# Patient Record
Sex: Male | Born: 1945 | Race: Black or African American | Hispanic: No | Smoking: Never smoker
Health system: Southern US, Community
[De-identification: ages and names within clinical notes are randomized; demographics above are authoritative.]

---

## 2003-05-15 ENCOUNTER — Emergency Department (HOSPITAL_COMMUNITY): Admission: EM | Admit: 2003-05-15 | Discharge: 2003-05-15 | Payer: Self-pay | Admitting: Emergency Medicine

## 2003-05-15 ENCOUNTER — Encounter: Payer: Self-pay | Admitting: Emergency Medicine

## 2012-07-18 ENCOUNTER — Encounter (HOSPITAL_COMMUNITY): Payer: Self-pay | Admitting: *Deleted

## 2012-07-18 ENCOUNTER — Emergency Department (HOSPITAL_COMMUNITY): Payer: No Typology Code available for payment source

## 2012-07-18 ENCOUNTER — Emergency Department (HOSPITAL_COMMUNITY)
Admission: EM | Admit: 2012-07-18 | Discharge: 2012-07-18 | Disposition: A | Discharge status: Home / Self Care | Payer: No Typology Code available for payment source | Attending: Emergency Medicine | Admitting: Emergency Medicine

## 2012-07-18 DIAGNOSIS — R079 Chest pain, unspecified: Secondary | ICD-10-CM | POA: Insufficient documentation

## 2012-07-18 DIAGNOSIS — M549 Dorsalgia, unspecified: Secondary | ICD-10-CM | POA: Insufficient documentation

## 2012-07-18 DIAGNOSIS — Y92410 Unspecified street and highway as the place of occurrence of the external cause: Secondary | ICD-10-CM | POA: Insufficient documentation

## 2012-07-18 DIAGNOSIS — M542 Cervicalgia: Secondary | ICD-10-CM | POA: Insufficient documentation

## 2012-07-18 DIAGNOSIS — Y9389 Activity, other specified: Secondary | ICD-10-CM | POA: Insufficient documentation

## 2012-07-18 MED ORDER — DIAZEPAM 5 MG PO TABS: 5.0000 mg | ORAL_TABLET | Freq: Two times a day (BID) | ORAL | Status: AC

## 2012-07-18 MED ORDER — HYDROCODONE-ACETAMINOPHEN 5-325 MG PO TABS: 2.0000 | ORAL_TABLET | Freq: Four times a day (QID) | ORAL | Status: DC | PRN
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
## 2012-07-19 NOTE — ED Provider Notes (Signed)
Medical screening examination/treatment/procedure(s) were performed by non-physician practitioner and as supervising physician I was immediately available for consultation/collaboration.  Flint Melter, MD 07/19/12 [phone]

## 2013-06-04 ENCOUNTER — Ambulatory Visit: Payer: Self-pay

## 2013-06-09 ENCOUNTER — Ambulatory Visit: Payer: Self-pay

## 2021-07-21 ENCOUNTER — Emergency Department (HOSPITAL_COMMUNITY)
Admission: EM | Admit: 2021-07-21 | Discharge: 2021-07-22 | Disposition: A | Discharge status: Home / Self Care | Payer: Medicare PPO | Attending: Emergency Medicine | Admitting: Emergency Medicine

## 2021-07-21 ENCOUNTER — Other Ambulatory Visit: Payer: Self-pay

## 2021-07-21 ENCOUNTER — Emergency Department (HOSPITAL_COMMUNITY): Payer: Medicare PPO

## 2021-07-21 DIAGNOSIS — S81811A Laceration without foreign body, right lower leg, initial encounter: Secondary | ICD-10-CM | POA: Insufficient documentation

## 2021-07-21 DIAGNOSIS — Z23 Encounter for immunization: Secondary | ICD-10-CM | POA: Insufficient documentation

## 2021-07-21 DIAGNOSIS — S61412A Laceration without foreign body of left hand, initial encounter: Secondary | ICD-10-CM | POA: Diagnosis not present

## 2021-07-21 DIAGNOSIS — S81832A Puncture wound without foreign body, left lower leg, initial encounter: Secondary | ICD-10-CM | POA: Insufficient documentation

## 2021-07-21 DIAGNOSIS — W540XXA Bitten by dog, initial encounter: Secondary | ICD-10-CM | POA: Diagnosis not present

## 2021-07-21 DIAGNOSIS — W1839XA Other fall on same level, initial encounter: Secondary | ICD-10-CM | POA: Diagnosis not present

## 2021-07-21 DIAGNOSIS — S51832A Puncture wound without foreign body of left forearm, initial encounter: Secondary | ICD-10-CM | POA: Insufficient documentation

## 2021-07-21 DIAGNOSIS — S59912A Unspecified injury of left forearm, initial encounter: Secondary | ICD-10-CM | POA: Diagnosis present

## 2021-07-21 MED ORDER — AMOXICILLIN-POT CLAVULANATE 875-125 MG PO TABS
1.0000 | ORAL_TABLET | Freq: Once | ORAL | Status: AC
  Administered 2021-07-21: 1 via ORAL
  Filled 2021-07-21: qty 1

## 2021-07-21 MED ORDER — BACITRACIN ZINC 500 UNIT/GM EX OINT
TOPICAL_OINTMENT | CUTANEOUS | Status: AC
  Filled 2021-07-21: qty 3.6

## 2021-07-21 MED ORDER — AMOXICILLIN-POT CLAVULANATE 875-125 MG PO TABS: 1.0000 | ORAL_TABLET | Freq: Two times a day (BID) | ORAL | 0 refills | Status: AC

## 2021-07-21 MED ORDER — HYDROCODONE-ACETAMINOPHEN 5-325 MG PO TABS: 0.5000 | ORAL_TABLET | Freq: Four times a day (QID) | ORAL | 0 refills | Status: AC | PRN

## 2021-07-21 MED ORDER — METHOCARBAMOL 500 MG PO TABS: 500.0000 mg | ORAL_TABLET | Freq: Two times a day (BID) | ORAL | 0 refills | Status: AC | PRN

## 2021-07-21 MED ORDER — LIDOCAINE-EPINEPHRINE (PF) 2 %-1:200000 IJ SOLN
20.0000 mL | Freq: Once | INTRAMUSCULAR | Status: AC
  Administered 2021-07-21: 20 mL
  Filled 2021-07-21: qty 20

## 2021-07-21 MED ORDER — TETANUS-DIPHTH-ACELL PERTUSSIS 5-2.5-18.5 LF-MCG/0.5 IM SUSY
0.5000 mL | PREFILLED_SYRINGE | Freq: Once | INTRAMUSCULAR | Status: AC
  Administered 2021-07-21: 0.5 mL via INTRAMUSCULAR
  Filled 2021-07-21: qty 0.5

## 2021-07-21 MED ORDER — METHOCARBAMOL 500 MG PO TABS
500.0000 mg | ORAL_TABLET | Freq: Once | ORAL | Status: AC
  Administered 2021-07-22: 500 mg via ORAL
  Filled 2021-07-21: qty 1

## 2021-07-21 NOTE — Discharge Instructions (Addendum)
1. Medications: Tylenol or ibuprofen for pain, Augmentin for infection. Take norco as needed for severe or breakthrough pain. Have caution, this may make you tired or groggy. Use robaxin as needed for muscle pain or soreness. 2. Treatment: ice for swelling, keep wound clean with warm soap and water and keep bandage dry 3. Follow Up: Please return in 10-14 days to have your stitches removed or sooner if you have concerns. Return to the emergency department for increased redness, drainage of pus from the wound   WOUND CARE  Remove bandage and wash wound gently with mild soap and warm water. Reapply a new bandage after cleaning wound, if directed.   Continue daily cleansing with soap and water until stitches are removed.  Do not apply any ointments or creams to the wound while stitches are in place, as this may cause delayed healing. Return if you experience any of the following signs of infection: Swelling, redness, pus drainage, streaking, fever >101.0 F  Return if you experience excessive bleeding that does not stop after 15-20 minutes of constant, firm pressure.

## 2021-07-21 NOTE — ED Triage Notes (Addendum)
Pt [facility]. Pt bit by sons dog. Pt has puncture wounds to both legs. Pt has puncture wounds to both wrists, and right hand. Pt fell after dog bites. Pt states dog has had its shots.

## 2021-07-21 NOTE — ED Provider Notes (Signed)
[facility]-EMERGENCY DEPT Provider Note   CSN: [id number] Arrival date & time: 07/21/21  1912     History Chief Complaint  Patient presents with   Animal Bite    Mark Gross is a 75 y.o. male presenting for evaluation after a Gross bite.   Patient states just prior to arrival he was bitten Mark Gross.  When he was bitten, he fell back, but did not hit his head.  No injury from the fall.  He reports pain and injury mostly of the right leg, but also the left leg and bilateral hands.  He is not on blood thinners.  He has a h/o asthma for which he takes albuterol, no other medical problems or medicine.  Reports pain at the site of the bites and surrounding areas.  He has been able to ambulate.  No numbness or tingling.  His tetanus is not up-to-date.  He denies injury on his torso   HPI     No past medical history on file.  There are no problems to display for this patient.   No past surgical history on file.     No family history on file.  Social History   Tobacco Use   Smoking status: Never  Substance Use Topics   Alcohol use: No   Drug use: No    Home Medications Prior to Admission medications   Medication Sig Start Date End Date Taking? Authorizing Provider  amoxicillin-clavulanate (AUGMENTIN) 875-125 MG tablet Take 1 tablet Mark mouth every 12 (twelve) hours. 07/21/21  Yes Cleston Lautner, PA-C  HYDROcodone-acetaminophen (NORCO/VICODIN) 5-325 MG tablet Take 0.5-1 tablets Mark mouth every 6 (six) hours as needed. 07/21/21  Yes Chantia Amalfitano, PA-C  methocarbamol (ROBAXIN) 500 MG tablet Take 1 tablet (500 mg total) Mark mouth 2 (two) times daily as needed for muscle spasms. 07/21/21  Yes Romanita Fager, PA-C  diazepam (VALIUM) 5 MG tablet Take 1 tablet (5 mg total) Mark mouth 2 (two) times daily. 07/18/12   Santiago Glad, PA-C  ibuprofen (ADVIL,MOTRIN) 200 MG tablet Take 200 mg Mark mouth every 6 (six) hours as needed.    [provider]  Pseudoeph-Doxylamine-DM-APAP (NYQUIL PO) Take 1 capsule Mark mouth at bedtime as needed. sleep    [provider]    Allergies    Patient has no known allergies.  Review of Systems   Review of Systems  Skin:  Positive for wound.  Hematological:  Does not bruise/bleed easily.  All other systems reviewed and are negative.  Physical Exam Updated Vital Signs BP 132/77   Pulse 87   Temp 97.9 F (36.6 C) (Oral)   Resp 18   SpO2 99%   Physical Exam Vitals and nursing note reviewed.  Constitutional:      General: He is not in acute distress.    Appearance: Normal appearance.  HENT:     Head: Normocephalic and atraumatic.  Eyes:     Conjunctiva/sclera: Conjunctivae normal.     Pupils: Pupils are equal, round, and reactive to light.  Cardiovascular:     Rate and Rhythm: Normal rate and regular rhythm.     Pulses: Normal pulses.  Pulmonary:     Effort: Pulmonary effort is normal. No respiratory distress.     Breath sounds: Normal breath sounds. No wheezing.     Comments: Speaking in full sentences.  Clear lung sounds in all fields. Abdominal:     General: There is no distension.     Palpations:  Abdomen is soft. There is no mass.     Tenderness: There is no abdominal tenderness. There is no guarding or rebound.  Musculoskeletal:        General: Normal range of motion.       Arms:     Cervical back: Normal range of motion and neck supple.       Legs:     Comments: Many puncture wounds and lacerations of the upper and lower extremities. On the upper extremities, laceration on the palm of the left hand and over the distal ulna.  Puncture wounds of the volar wrist/distal forearm of the right upper extremity. Large gaping lacerations on the medial right lower leg.  Multiple puncture wounds and skin abrasions of the lateral leg. V-shaped gaping wound over the posterior left calf.  Gaping wound of the L anterior shin.  Multiple puncture wounds and abrasions of the  anterior left shin   Skin:    General: Skin is warm and dry.     Capillary Refill: Capillary refill takes less than 2 seconds.  Neurological:     Mental Status: He is alert and oriented to person, place, and time.  Psychiatric:        Mood and Affect: Mood and affect normal.        Speech: Speech normal.        Behavior: Behavior normal.     R ant leg    R medial leg    L post leg     L ant leg  ED Results / Procedures / Treatments   Labs (all labs ordered are listed, but only abnormal results are displayed) Labs Reviewed - No data to display  EKG None  Radiology DG Forearm Right  Result Date: 07/21/2021 CLINICAL DATA:  Gross bite. Puncture wounds. Patient fell after Gross bite. EXAM: RIGHT FOREARM - 2 VIEW COMPARISON:  None. FINDINGS: Cortical margins of the radius and ulna are intact. There is no evidence of fracture or other focal bone lesions. There is air and soft tissue edema about the volar aspect of the distal forearm. No radiopaque foreign body IMPRESSION: Air and soft tissue edema about the volar aspect of the distal forearm. No radiopaque foreign body or acute osseous abnormality. Electronically Signed   Mark: Narda Rutherford M.D.   On: 07/21/2021 22:15   DG Wrist Complete Left  Result Date: 07/21/2021 CLINICAL DATA:  Gross bite. Puncture wounds. Patient fell after Gross bite. EXAM: LEFT WRIST - COMPLETE 3+ VIEW COMPARISON:  None. FINDINGS: There is no evidence of fracture or dislocation. 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 Soft tissue edema overlies the dorsum of the wrist. No radiopaque foreign body. IMPRESSION: 1. Soft tissue edema. No radiopaque foreign body or acute fracture. 2. 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 Electronically Signed   Mark: Narda Rutherford M.D.   On: 07/21/2021 22:16   DG Wrist Complete Right  Result Date: 07/21/2021 CLINICAL DATA:  Gross bite.  Puncture wounds. Patient fell after Gross bite. EXAM: RIGHT WRIST - COMPLETE 3+ VIEW COMPARISON:  None. FINDINGS: There is no evidence of fracture or dislocation. Marked thumb osteoarthritis at the carpal metacarpal joint with joint space narrowing, subchondral sclerosis on fragmented osteophytes. No erosion or bony destruction. Air and soft tissue edema about the volar wrist. No radiopaque foreign body. IMPRESSION: 1. Air and soft tissue edema about the volar wrist. No radiopaque foreign body or acute osseous abnormality.  2. Marked thumb osteoarthritis. Electronically Signed   Mark: Narda Rutherford M.D.   On: 07/21/2021 22:14   DG Tibia/Fibula Left  Result Date: 07/21/2021 CLINICAL DATA:  Gross bite. Puncture wounds. Patient fell after Gross bite EXAM: LEFT TIBIA AND FIBULA - 2 VIEW COMPARISON:  None. FINDINGS: The cortical margins of the tibia and fibula are intact. There is no evidence of fracture or other focal bone lesions. Mild soft tissue edema. No radiopaque foreign body. IMPRESSION: Soft tissue edema. No fracture or radiopaque foreign body. Electronically Signed   Mark: Narda Rutherford M.D.   On: 07/21/2021 22:18   DG Tibia/Fibula Right  Result Date: 07/21/2021 CLINICAL DATA:  Gross bite. Puncture wounds. Patient fell after Gross bite EXAM: RIGHT TIBIA AND FIBULA - 2 VIEW COMPARISON:  None. FINDINGS: The cortical margins of the tibia and fibula are intact. There is no evidence of fracture or other focal bone lesions. There is air and soft tissue edema about the calf and mid anterior soft tissues. No radiopaque foreign body. IMPRESSION: Soft tissue edema and air about the calf. No radiopaque foreign body or acute osseous abnormality. Electronically Signed   Mark: Narda Rutherford M.D.   On: 07/21/2021 22:17   DG Hand Complete Left  Result Date: 07/21/2021 CLINICAL DATA:  Gross bite. Puncture wounds. Patient fell after Gross bite. EXAM: LEFT HAND - COMPLETE 3+ VIEW COMPARISON:  None. FINDINGS: There is no evidence  of fracture or dislocation. Marked thumb osteoarthritis at the carpal metacarpal joint with joint space narrowing, subchondral cystic change in bony fragmentation. Lesser osteoarthritis of the digits. The second and fifth digits are held in flexion at the proximal interphalangeal joints on all views. Carpal bone cysts are typically degenerative. Well corticated density adjacent to the ulna styloid may be sequela of prior injury or an accessory ossicle. Mild soft tissue edema overlies the dorsal wrist. No radiopaque foreign body. IMPRESSION: 1. No fracture or dislocation. 2. Soft tissue edema. 3. 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 Electronically Signed   Mark: Narda Rutherford M.D.   On: 07/21/2021 22:16    Procedures .Marland KitchenLaceration Repair  Date/Time: 07/21/2021 11:29 PM Performed Mark: Alveria Apley, PA-C Authorized Mark: Alveria Apley, PA-C   Consent:    Consent obtained:  Verbal   Consent given Mark:  Patient   Risks discussed:  Infection, pain, poor cosmetic result, poor wound healing and need for additional repair Anesthesia:    Anesthesia method:  Local infiltration   Local anesthetic:  Lidocaine 2% WITH epi Laceration details:    Location:  Leg   Leg location:  L lower leg   Length (cm):  2 Pre-procedure details:    Preparation:  Patient was prepped and draped in usual sterile fashion and imaging obtained to evaluate for foreign bodies Exploration:    Wound exploration: wound explored through full range of motion and entire depth of wound visualized     Wound extent: no underlying fracture noted   Treatment:    Area cleansed with:  Saline and povidone-iodine   Amount of cleaning:  Extensive   Irrigation solution:  Sterile water   Irrigation method:  Pressure wash Skin repair:    Repair method:  Sutures   Suture size:  4-0   Suture material:  Prolene   Suture technique:  Simple interrupted   Number of sutures:  1 Approximation:     Approximation:  Loose Repair type:    Repair type:  Intermediate Post-procedure details:  Dressing:  Antibiotic ointment and sterile dressing   Procedure completion:  Tolerated well, no immediate complications .Marland KitchenLaceration Repair  Date/Time: 07/21/2021 11:31 PM Performed Mark: Alveria Apley, PA-C Authorized Mark: Alveria Apley, PA-C   Consent:    Consent obtained:  Verbal   Consent given Mark:  Patient   Risks discussed:  Infection, pain, poor cosmetic result, poor wound healing and need for additional repair Anesthesia:    Anesthesia method:  Local infiltration   Local anesthetic:  Lidocaine 2% WITH epi Laceration details:    Location:  Leg   Leg location:  L lower leg   Length (cm):  2 Pre-procedure details:    Preparation:  Patient was prepped and draped in usual sterile fashion and imaging obtained to evaluate for foreign bodies Exploration:    Wound extent: no underlying fracture noted   Treatment:    Area cleansed with:  Saline and povidone-iodine   Amount of cleaning:  Extensive   Irrigation solution:  Sterile water   Irrigation method:  Pressure wash Skin repair:    Repair method:  Sutures   Suture size:  4-0   Suture material:  Prolene   Suture technique:  Simple interrupted   Number of sutures:  1 Approximation:    Approximation:  Loose Repair type:    Repair type:  Intermediate Post-procedure details:    Dressing:  Antibiotic ointment and sterile dressing   Procedure completion:  Tolerated well, no immediate complications .Marland KitchenLaceration Repair  Date/Time: 07/21/2021 11:32 PM Performed Mark: Alveria Apley, PA-C Authorized Mark: Alveria Apley, PA-C   Consent:    Consent obtained:  Verbal   Consent given Mark:  Patient   Risks discussed:  Infection, pain, poor cosmetic result, poor wound healing and need for additional repair Anesthesia:    Anesthesia method:  Local infiltration   Local anesthetic:  Lidocaine 2% WITH epi Laceration details:     Location:  Leg   Leg location:  R lower leg   Length (cm):  4 Pre-procedure details:    Preparation:  Patient was prepped and draped in usual sterile fashion and imaging obtained to evaluate for foreign bodies Exploration:    Wound exploration: wound explored through full range of motion and entire depth of wound visualized     Wound extent: no underlying fracture noted   Treatment:    Area cleansed with:  Saline and povidone-iodine   Amount of cleaning:  Extensive   Irrigation solution:  Sterile water   Irrigation method:  Pressure wash Skin repair:    Repair method:  Sutures   Suture size:  4-0   Suture material:  Prolene   Suture technique:  Simple interrupted   Number of sutures:  3 Approximation:    Approximation:  Loose Repair type:    Repair type:  Intermediate Post-procedure details:    Dressing:  Antibiotic ointment and sterile dressing   Procedure completion:  Tolerated well, no immediate complications .Marland KitchenLaceration Repair  Date/Time: 07/21/2021 11:32 PM Performed Mark: Alveria Apley, PA-C Authorized Mark: Alveria Apley, PA-C   Consent:    Consent obtained:  Verbal   Consent given Mark:  Patient   Risks discussed:  Infection, poor cosmetic result, pain, poor wound healing and need for additional repair Anesthesia:    Anesthesia method:  Local infiltration   Local anesthetic:  Lidocaine 2% WITH epi Laceration details:    Location:  Leg   Leg location:  R lower leg   Length (cm):  2 Pre-procedure details:  Preparation:  Patient was prepped and draped in usual sterile fashion and imaging obtained to evaluate for foreign bodies Exploration:    Wound exploration: wound explored through full range of motion and entire depth of wound visualized     Wound extent: no underlying fracture noted   Treatment:    Area cleansed with:  Saline and povidone-iodine   Amount of cleaning:  Standard   Irrigation solution:  Sterile water   Irrigation method:  Pressure  wash Skin repair:    Repair method:  Sutures   Suture size:  4-0   Suture material:  Prolene   Suture technique:  Simple interrupted   Number of sutures:  1 Approximation:    Approximation:  Loose Repair type:    Repair type:  Intermediate Post-procedure details:    Dressing:  Antibiotic ointment and sterile dressing   Procedure completion:  Tolerated well, no immediate complications .Marland KitchenLaceration Repair  Date/Time: 07/21/2021 11:33 PM Performed Mark: Alveria Apley, PA-C Authorized Mark: Alveria Apley, PA-C   Consent:    Consent obtained:  Verbal   Consent given Mark:  Patient   Risks discussed:  Infection, pain, poor cosmetic result, poor wound healing and need for additional repair Anesthesia:    Anesthesia method:  Local infiltration   Local anesthetic:  Lidocaine 2% WITH epi Laceration details:    Location:  Leg   Leg location:  R lower leg   Length (cm):  2 Pre-procedure details:    Preparation:  Patient was prepped and draped in usual sterile fashion and imaging obtained to evaluate for foreign bodies Exploration:    Wound exploration: wound explored through full range of motion and entire depth of wound visualized     Wound extent: no underlying fracture noted   Treatment:    Area cleansed with:  Saline and povidone-iodine   Amount of cleaning:  Extensive   Irrigation solution:  Sterile water   Irrigation method:  Pressure wash Skin repair:    Repair method:  Sutures   Suture size:  4-0   Suture material:  Prolene   Suture technique:  Simple interrupted   Number of sutures:  1 Approximation:    Approximation:  Loose Repair type:    Repair type:  Intermediate Post-procedure details:    Dressing:  Antibiotic ointment and sterile dressing   Procedure completion:  Tolerated well, no immediate complications .Marland KitchenLaceration Repair  Date/Time: 07/21/2021 11:34 PM Performed Mark: Alveria Apley, PA-C Authorized Mark: Alveria Apley, PA-C   Consent:    Consent  obtained:  Verbal   Consent given Mark:  Patient   Risks discussed:  Infection, pain, poor cosmetic result, poor wound healing and need for additional repair Anesthesia:    Anesthesia method:  Local infiltration   Local anesthetic:  Lidocaine 2% WITH epi Laceration details:    Location:  Leg   Leg location:  R lower leg   Length (cm):  1 Pre-procedure details:    Preparation:  Patient was prepped and draped in usual sterile fashion and imaging obtained to evaluate for foreign bodies Exploration:    Wound exploration: wound explored through full range of motion and entire depth of wound visualized     Wound extent: no underlying fracture noted   Treatment:    Area cleansed with:  Saline and povidone-iodine   Amount of cleaning:  Extensive   Irrigation solution:  Sterile water   Irrigation method:  Pressure wash Skin repair:    Repair method:  Sutures   Suture  size:  4-0   Suture material:  Prolene   Suture technique:  Simple interrupted   Number of sutures:  1 Approximation:    Approximation:  Loose Repair type:    Repair type:  Intermediate Post-procedure details:    Dressing:  Antibiotic ointment and sterile dressing   Procedure completion:  Tolerated well, no immediate complications   Medications Ordered in ED Medications  bacitracin 500 UNIT/GM ointment (has no administration in time range)  methocarbamol (ROBAXIN) tablet 500 mg (has no administration in time range)  lidocaine-EPINEPHrine (XYLOCAINE W/EPI) 2 %-1:200000 (PF) injection 20 mL (20 mLs Infiltration Given Mark Other 07/21/21 2132)  Tdap (BOOSTRIX) injection 0.5 mL (0.5 mLs Intramuscular Given 07/21/21 2132)  amoxicillin-clavulanate (AUGMENTIN) 875-125 MG per tablet 1 tablet (1 tablet Oral Given 07/21/21 2132)    ED Course  I have reviewed the triage vital signs and the nursing notes.  Pertinent labs & imaging results that were available during my care of the patient were reviewed Mark me and considered in my medical  decision making (see chart for details).    MDM Rules/Calculators/A&P                           Patient presenting for evaluation after Gross bite.  On exam, patient is neurovascular intact.  He has multiple puncture wounds and lacerations, some of which will require repair.  Considering age and location of injury, will obtain x-rays to ensure no bony abnormality.  Tetanus updated in the ED.  X-rays viewed and independently interpreted Mark me, no fracture, dislocation, or foreign body.  Lacerations extensively irrigated.  Repaired as described above.  Aftercare instructions given.  Discussed importance of close monitoring for infection. At this time, pt appears safe for d/c. Return precautions given. Pt states he understands and agrees to plan.  Final Clinical Impression(s) / ED Diagnoses Final diagnoses:  Gross bite, initial encounter    Rx / DC Orders ED Discharge Orders          Ordered    amoxicillin-clavulanate (AUGMENTIN) 875-125 MG tablet  Every 12 hours        07/21/21 2359    methocarbamol (ROBAXIN) 500 MG tablet  2 times daily PRN        07/21/21 2359    HYDROcodone-acetaminophen (NORCO/VICODIN) 5-325 MG tablet  Every 6 hours PRN        07/21/21 2359             Alveria Apley, PA-C 07/22/21 0016    Pollyann Savoy, MD 07/23/21 2257

## 2022-12-26 DIAGNOSIS — R0602 Shortness of breath: Secondary | ICD-10-CM | POA: Diagnosis not present

## 2022-12-26 DIAGNOSIS — R9431 Abnormal electrocardiogram [ECG] [EKG]: Secondary | ICD-10-CM | POA: Diagnosis not present

## 2022-12-26 DIAGNOSIS — R079 Chest pain, unspecified: Secondary | ICD-10-CM | POA: Diagnosis not present

## 2022-12-26 DIAGNOSIS — Z125 Encounter for screening for malignant neoplasm of prostate: Secondary | ICD-10-CM | POA: Diagnosis not present

## 2022-12-26 DIAGNOSIS — Z0000 Encounter for general adult medical examination without abnormal findings: Secondary | ICD-10-CM | POA: Diagnosis not present

## 2022-12-26 DIAGNOSIS — R5383 Other fatigue: Secondary | ICD-10-CM | POA: Diagnosis not present

## 2022-12-26 DIAGNOSIS — E559 Vitamin D deficiency, unspecified: Secondary | ICD-10-CM | POA: Diagnosis not present

## 2022-12-26 DIAGNOSIS — Z1159 Encounter for screening for other viral diseases: Secondary | ICD-10-CM | POA: Diagnosis not present

## 2022-12-26 DIAGNOSIS — Z131 Encounter for screening for diabetes mellitus: Secondary | ICD-10-CM | POA: Diagnosis not present

## 2022-12-26 DIAGNOSIS — Z9181 History of falling: Secondary | ICD-10-CM | POA: Diagnosis not present

## 2022-12-28 IMAGING — DX DG TIBIA/FIBULA 2V*L*
3 series · 3 of 3 positions shown · non-contrast
Comparison: None.

CLINICAL DATA: Dog bite. Puncture wounds. Patient fell after dog
bite

EXAM:
LEFT TIBIA AND FIBULA - 2 VIEW

[tibia ap]
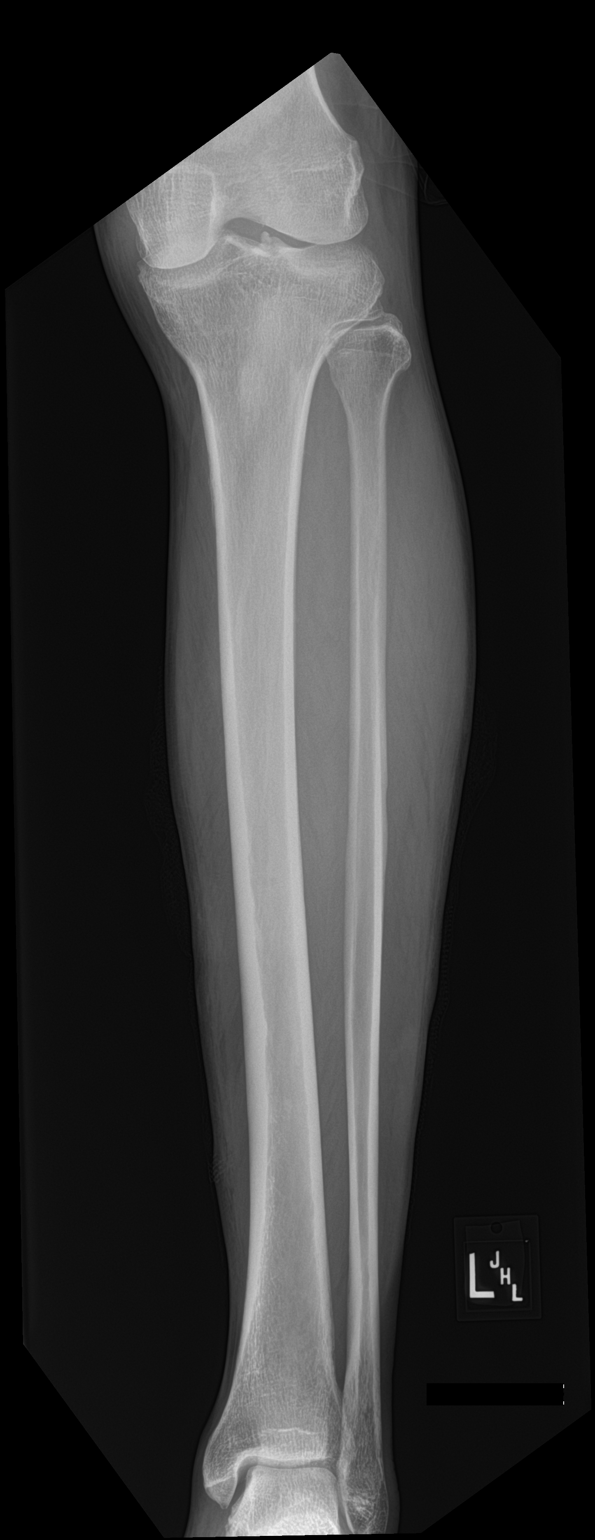

[tibia lat (1 of 2)]
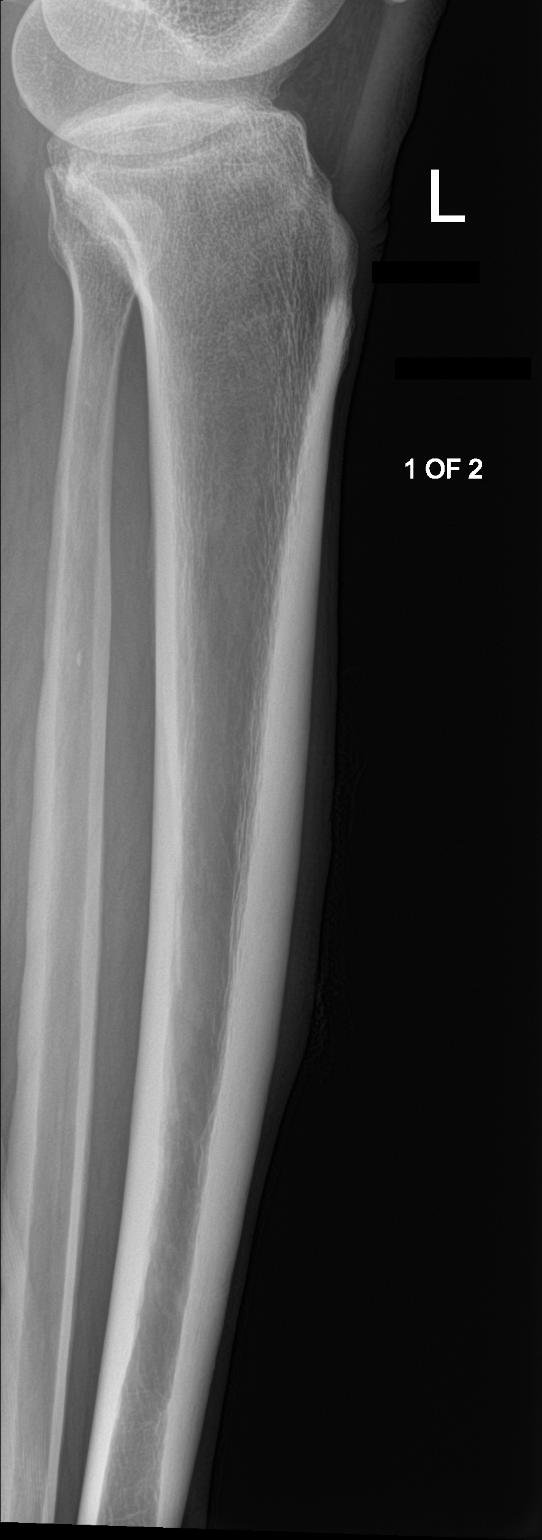

[tibia lat (2 of 2)]
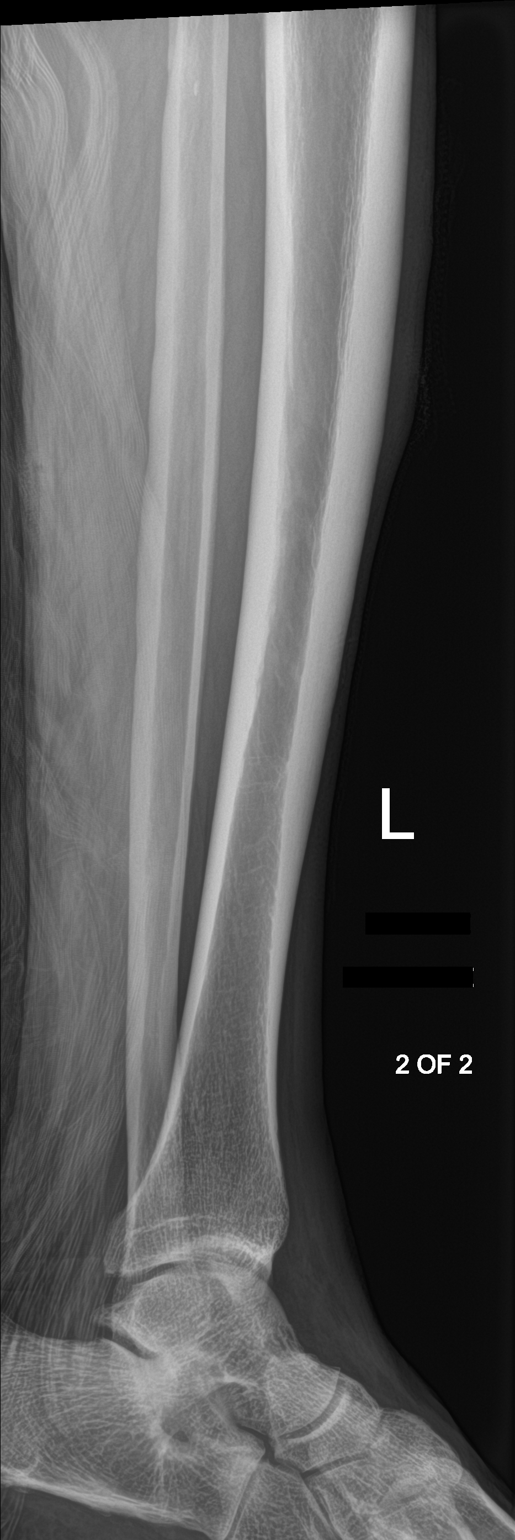

[3 of 3 positions shown; findings below may reference images not displayed]

FINDINGS: The cortical margins of the tibia and fibula are intact. There is no
evidence of fracture or other focal bone lesions. Mild soft tissue
edema. No radiopaque foreign body.
IMPRESSION: Soft tissue edema. No fracture or radiopaque foreign body.

## 2023-01-10 DIAGNOSIS — Z131 Encounter for screening for diabetes mellitus: Secondary | ICD-10-CM | POA: Diagnosis not present

## 2023-01-10 DIAGNOSIS — Z9181 History of falling: Secondary | ICD-10-CM | POA: Diagnosis not present

## 2023-01-10 DIAGNOSIS — E559 Vitamin D deficiency, unspecified: Secondary | ICD-10-CM | POA: Diagnosis not present

## 2023-01-10 DIAGNOSIS — R0602 Shortness of breath: Secondary | ICD-10-CM | POA: Diagnosis not present
